# Patient Record
Sex: Female | Born: 1960 | Race: White | Hispanic: No | Marital: Single | Smoking: Smoker, current status unknown
Health system: Southern US, Community
[De-identification: ages and names within clinical notes are randomized; demographics above are authoritative.]

---

## 1997-12-10 ENCOUNTER — Encounter: Payer: Self-pay | Admitting: Urology

## 1997-12-10 ENCOUNTER — Ambulatory Visit (HOSPITAL_COMMUNITY): Admission: RE | Admit: 1997-12-10 | Discharge: 1997-12-10 | Payer: Self-pay | Admitting: Urology

## 1997-12-15 ENCOUNTER — Encounter: Payer: Self-pay | Admitting: Urology

## 1997-12-15 ENCOUNTER — Ambulatory Visit (HOSPITAL_COMMUNITY): Admission: RE | Admit: 1997-12-15 | Discharge: 1997-12-15 | Payer: Self-pay | Admitting: Urology

## 1997-12-29 ENCOUNTER — Inpatient Hospital Stay (HOSPITAL_COMMUNITY): Admission: EM | Admit: 1997-12-29 | Discharge: 1998-01-02 | Payer: Self-pay | Admitting: Urology

## 1997-12-29 ENCOUNTER — Encounter: Payer: Self-pay | Admitting: Urology

## 1997-12-30 ENCOUNTER — Encounter: Payer: Self-pay | Admitting: Urology

## 1998-04-15 ENCOUNTER — Encounter: Payer: Self-pay | Admitting: Urology

## 1998-04-16 ENCOUNTER — Inpatient Hospital Stay (HOSPITAL_COMMUNITY): Admission: RE | Admit: 1998-04-16 | Discharge: 1998-04-18 | Payer: Self-pay | Admitting: Urology

## 1998-09-17 ENCOUNTER — Emergency Department (HOSPITAL_COMMUNITY): Admission: EM | Admit: 1998-09-17 | Discharge: 1998-09-17 | Payer: Self-pay | Admitting: *Deleted

## 1998-09-18 ENCOUNTER — Encounter: Payer: Self-pay | Admitting: Emergency Medicine

## 1999-04-12 ENCOUNTER — Emergency Department (HOSPITAL_COMMUNITY): Admission: EM | Admit: 1999-04-12 | Discharge: 1999-04-12 | Payer: Self-pay | Admitting: Emergency Medicine

## 1999-04-12 ENCOUNTER — Encounter: Payer: Self-pay | Admitting: Emergency Medicine

## 1999-05-24 ENCOUNTER — Inpatient Hospital Stay (HOSPITAL_COMMUNITY): Admission: AD | Admit: 1999-05-24 | Discharge: 1999-05-25 | Payer: Self-pay | Admitting: Urology

## 1999-05-25 ENCOUNTER — Encounter: Payer: Self-pay | Admitting: Urology

## 1999-05-27 ENCOUNTER — Ambulatory Visit (HOSPITAL_COMMUNITY): Admission: RE | Admit: 1999-05-27 | Discharge: 1999-05-27 | Payer: Self-pay | Admitting: Urology

## 1999-06-03 ENCOUNTER — Ambulatory Visit (HOSPITAL_COMMUNITY): Admission: RE | Admit: 1999-06-03 | Discharge: 1999-06-03 | Payer: Self-pay | Admitting: Urology

## 1999-06-03 ENCOUNTER — Encounter: Payer: Self-pay | Admitting: Urology

## 1999-06-10 ENCOUNTER — Inpatient Hospital Stay (HOSPITAL_COMMUNITY): Admission: AD | Admit: 1999-06-10 | Discharge: 1999-06-10 | Payer: Self-pay | Admitting: Urology

## 1999-06-23 ENCOUNTER — Emergency Department (HOSPITAL_COMMUNITY): Admission: EM | Admit: 1999-06-23 | Discharge: 1999-06-23 | Payer: Self-pay | Admitting: Emergency Medicine

## 2000-08-26 ENCOUNTER — Emergency Department (HOSPITAL_COMMUNITY): Admission: EM | Admit: 2000-08-26 | Discharge: 2000-08-26 | Payer: Self-pay

## 2003-02-23 ENCOUNTER — Inpatient Hospital Stay (HOSPITAL_COMMUNITY): Admission: EM | Admit: 2003-02-23 | Discharge: 2003-02-28 | Payer: Self-pay | Admitting: Emergency Medicine

## 2004-11-29 IMAGING — CR DG ABDOMEN 1V
1 series · 1 of 1 positions shown · non-contrast
Comparison: none

CLINICAL DATA: Patient has been scheduled for an upper GI exam.  
 SCOUT RADIOGRAPH OF THE ABDOMEN 02/28/03
 Compared to 02/27/03.  
 There is persistent extensive retained colonic contrast.  This does preclude adequate upper GI at this time.  Continued cleansing enema is suggested prior to an upper GI exam.  This was discussed with Dr. Shao. 
 IMPRESSION
 Nonobstructive bowel gas.  There is persistent extensive colonic contrast.

[view not recorded]
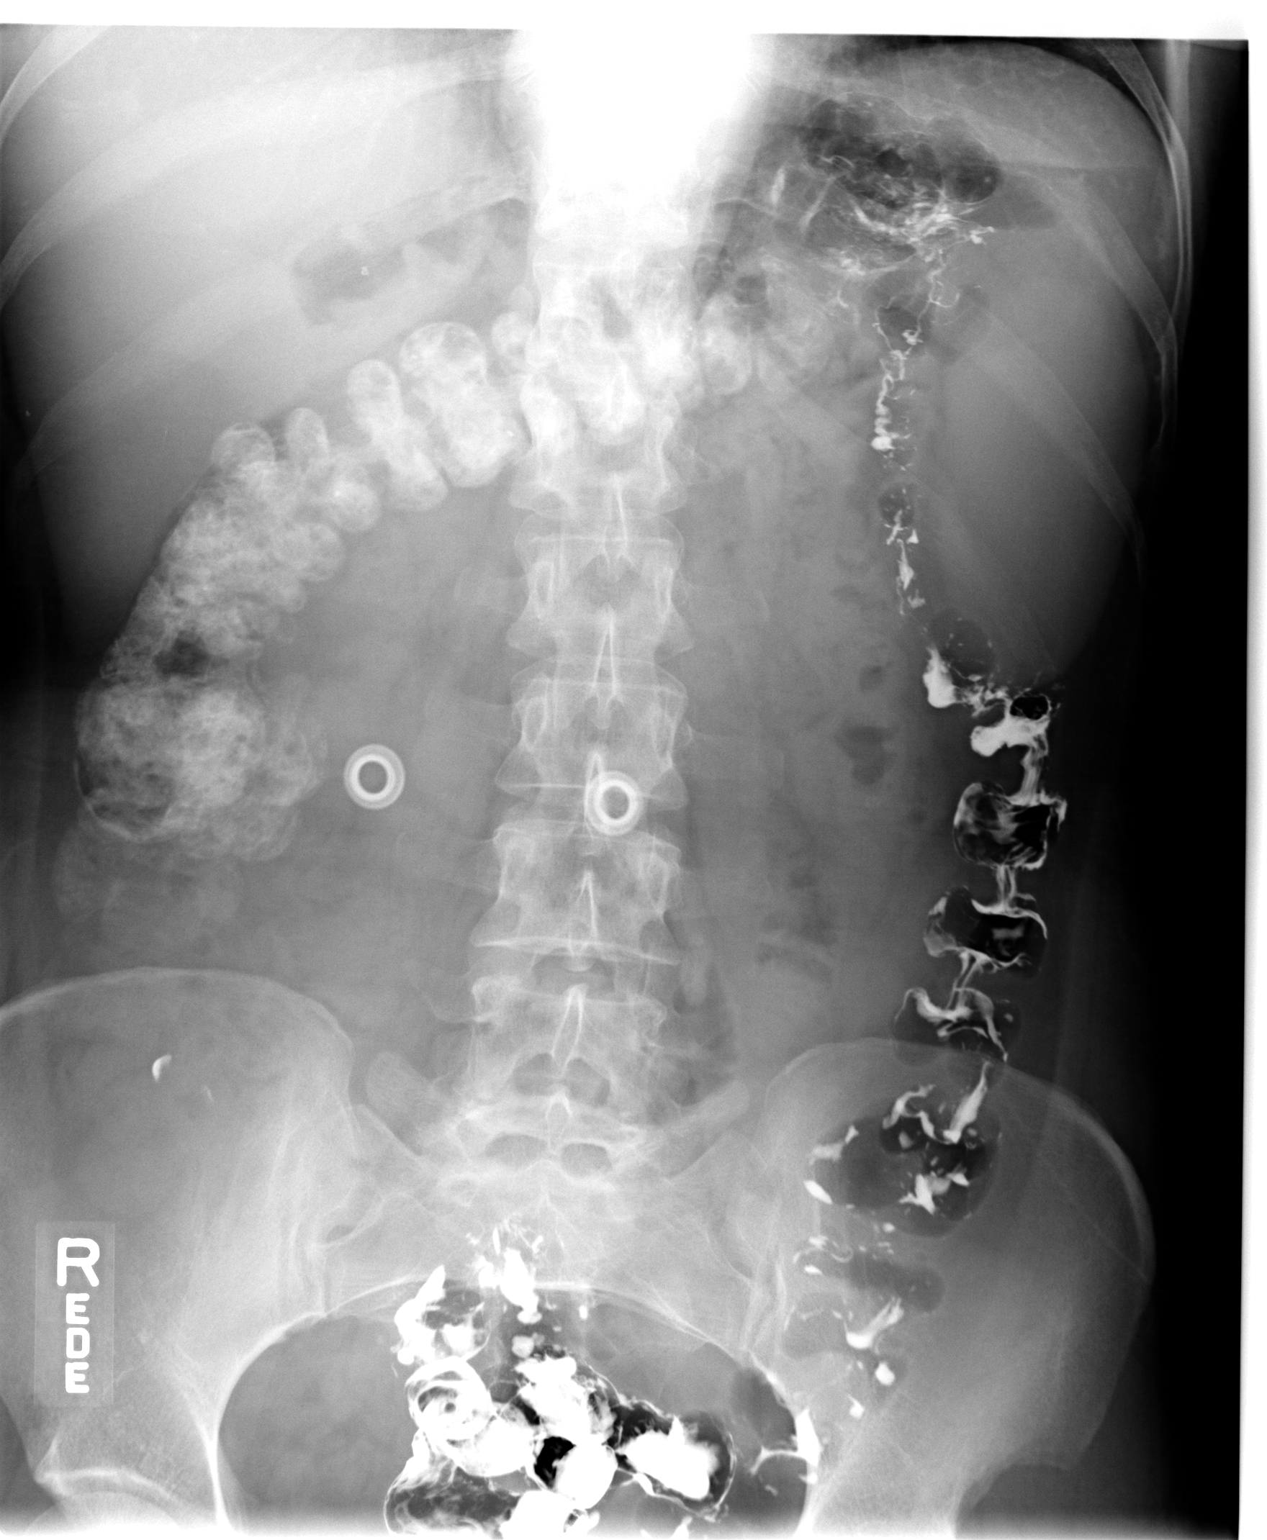

[1 of 1 positions shown; findings below may reference images not displayed]

## 2010-01-31 ENCOUNTER — Encounter: Payer: Self-pay | Admitting: Gastroenterology

## 2012-08-22 ENCOUNTER — Emergency Department (HOSPITAL_COMMUNITY): Payer: Medicaid Other

## 2012-08-22 ENCOUNTER — Inpatient Hospital Stay (HOSPITAL_COMMUNITY)
Admission: EM | Admit: 2012-08-22 | Discharge: 2012-09-10 | DRG: 296 | Disposition: E | Payer: Medicaid Other | Attending: Internal Medicine | Admitting: Internal Medicine

## 2012-08-22 DIAGNOSIS — I4510 Unspecified right bundle-branch block: Secondary | ICD-10-CM | POA: Diagnosis present

## 2012-08-22 DIAGNOSIS — E875 Hyperkalemia: Secondary | ICD-10-CM | POA: Diagnosis present

## 2012-08-22 DIAGNOSIS — G9382 Brain death: Secondary | ICD-10-CM | POA: Diagnosis not present

## 2012-08-22 DIAGNOSIS — J9600 Acute respiratory failure, unspecified whether with hypoxia or hypercapnia: Secondary | ICD-10-CM | POA: Diagnosis present

## 2012-08-22 DIAGNOSIS — G935 Compression of brain: Secondary | ICD-10-CM | POA: Diagnosis present

## 2012-08-22 DIAGNOSIS — G931 Anoxic brain damage, not elsewhere classified: Secondary | ICD-10-CM | POA: Diagnosis present

## 2012-08-22 DIAGNOSIS — Z66 Do not resuscitate: Secondary | ICD-10-CM | POA: Diagnosis not present

## 2012-08-22 DIAGNOSIS — I959 Hypotension, unspecified: Secondary | ICD-10-CM | POA: Diagnosis present

## 2012-08-22 DIAGNOSIS — E876 Hypokalemia: Secondary | ICD-10-CM | POA: Diagnosis not present

## 2012-08-22 DIAGNOSIS — N19 Unspecified kidney failure: Secondary | ICD-10-CM | POA: Diagnosis present

## 2012-08-22 DIAGNOSIS — E872 Acidosis, unspecified: Secondary | ICD-10-CM | POA: Diagnosis present

## 2012-08-22 DIAGNOSIS — R4020 Unspecified coma: Secondary | ICD-10-CM | POA: Diagnosis present

## 2012-08-22 DIAGNOSIS — G936 Cerebral edema: Secondary | ICD-10-CM | POA: Diagnosis present

## 2012-08-22 DIAGNOSIS — I469 Cardiac arrest, cause unspecified: Principal | ICD-10-CM

## 2012-08-22 LAB — COMPREHENSIVE METABOLIC PANEL
AST: 277 U/L — ABNORMAL HIGH (ref 0–37)
Albumin: 2.9 g/dL — ABNORMAL LOW (ref 3.5–5.2)
Calcium: 8.3 mg/dL — ABNORMAL LOW (ref 8.4–10.5)
Chloride: 101 mEq/L (ref 96–112)
Creatinine, Ser: 2.5 mg/dL — ABNORMAL HIGH (ref 0.50–1.10)
Total Protein: 5.4 g/dL — ABNORMAL LOW (ref 6.0–8.3)

## 2012-08-22 LAB — URINE MICROSCOPIC-ADD ON

## 2012-08-22 LAB — POCT I-STAT 3, ART BLOOD GAS (G3+)
Acid-base deficit: 10 mmol/L — ABNORMAL HIGH (ref 0.0–2.0)
O2 Saturation: 100 %
TCO2: 18 mmol/L (ref 0–100)
pCO2 arterial: 43.6 mmHg (ref 35.0–45.0)
pO2, Arterial: 503 mmHg — ABNORMAL HIGH (ref 80.0–100.0)

## 2012-08-22 LAB — BLOOD GAS, ARTERIAL
Drawn by: 31101
O2 Saturation: 98 %
PEEP: 5 cmH2O
Patient temperature: 98.6
RATE: 30 resp/min
pH, Arterial: 7.297 — ABNORMAL LOW (ref 7.350–7.450)

## 2012-08-22 LAB — CBC WITH DIFFERENTIAL/PLATELET
Basophils Absolute: 0 10*3/uL (ref 0.0–0.1)
Basophils Relative: 0 % (ref 0–1)
Eosinophils Absolute: 0.1 10*3/uL (ref 0.0–0.7)
HCT: 35.6 % — ABNORMAL LOW (ref 36.0–46.0)
MCH: 30.8 pg (ref 26.0–34.0)
MCHC: 32.6 g/dL (ref 30.0–36.0)
Monocytes Absolute: 0.3 10*3/uL (ref 0.1–1.0)
Neutro Abs: 10.2 10*3/uL — ABNORMAL HIGH (ref 1.7–7.7)
RDW: 14.4 % (ref 11.5–15.5)

## 2012-08-22 LAB — TROPONIN I: Troponin I: <0.3 ng/mL (ref <0.30)

## 2012-08-22 LAB — URINALYSIS, ROUTINE W REFLEX MICROSCOPIC
Bilirubin Urine: NEGATIVE
Ketones, ur: NEGATIVE mg/dL
Nitrite: NEGATIVE
Urobilinogen, UA: 0.2 mg/dL (ref 0.0–1.0)

## 2012-08-22 LAB — TYPE AND SCREEN: Antibody Screen: NEGATIVE

## 2012-08-22 LAB — PRO B NATRIURETIC PEPTIDE: Pro B Natriuretic peptide (BNP): 243.8 pg/mL — ABNORMAL HIGH (ref 0–125)

## 2012-08-22 LAB — RAPID URINE DRUG SCREEN, HOSP PERFORMED
Amphetamines: NOT DETECTED
Barbiturates: NOT DETECTED
Benzodiazepines: POSITIVE — AB

## 2012-08-22 LAB — PROCALCITONIN: Procalcitonin: <0.1 ng/mL

## 2012-08-22 LAB — APTT: aPTT: 24 seconds (ref 24–37)

## 2012-08-22 LAB — ETHANOL: Alcohol, Ethyl (B): <11 mg/dL (ref 0–11)

## 2012-08-22 LAB — SALICYLATE LEVEL: Salicylate Lvl: 7.7 mg/dL (ref 2.8–20.0)

## 2012-08-22 LAB — PROTIME-INR
INR: 1.01 (ref 0.00–1.49)
Prothrombin Time: 13.1 seconds (ref 11.6–15.2)

## 2012-08-22 LAB — AMYLASE: Amylase: 142 U/L — ABNORMAL HIGH (ref 0–105)

## 2012-08-22 LAB — CK: Total CK: 292 U/L — ABNORMAL HIGH (ref 7–177)

## 2012-08-22 LAB — ACETAMINOPHEN LEVEL: Acetaminophen (Tylenol), Serum: <15 ug/mL (ref 10–30)

## 2012-08-22 MED ORDER — NOREPINEPHRINE BITARTRATE 1 MG/ML IJ SOLN
0.5000 ug/min | INTRAVENOUS | Status: DC
  Administered 2012-08-23: 18 ug/min via INTRAVENOUS
  Filled 2012-08-22: qty 16

## 2012-08-22 MED ORDER — SODIUM CHLORIDE 0.9 % IV SOLN: INTRAVENOUS | Status: DC

## 2012-08-22 MED ORDER — NOREPINEPHRINE BITARTRATE 1 MG/ML IJ SOLN
0.5000 ug/min | INTRAVENOUS | Status: DC
  Administered 2012-08-22: 10 ug/min via INTRAVENOUS
  Filled 2012-08-22: qty 4

## 2012-08-22 MED ORDER — PANTOPRAZOLE SODIUM 40 MG IV SOLR
40.0000 mg | Freq: Every day | INTRAVENOUS | Status: DC
  Administered 2012-08-22: 40 mg via INTRAVENOUS
  Filled 2012-08-22 (×2): qty 40

## 2012-08-22 MED ORDER — SODIUM CHLORIDE 0.9 % IV SOLN: 250.0000 mL | INTRAVENOUS | Status: DC | PRN

## 2012-08-22 MED ORDER — SODIUM CHLORIDE 0.9 % IV BOLUS (SEPSIS)
500.0000 mL | Freq: Once | INTRAVENOUS | Status: AC
  Administered 2012-08-22: 500 mL via INTRAVENOUS

## 2012-08-22 MED ORDER — INSULIN ASPART 100 UNIT/ML ~~LOC~~ SOLN
0.0000 [IU] | SUBCUTANEOUS | Status: DC
Start: 2012-08-22 — End: 2012-08-23
  Administered 2012-08-23: 5 [IU] via SUBCUTANEOUS
  Administered 2012-08-23 (×2): 2 [IU] via SUBCUTANEOUS

## 2012-08-22 MED ORDER — SODIUM BICARBONATE 8.4 % IV SOLN
INTRAVENOUS | Status: DC
  Administered 2012-08-22: 23:00:00 via INTRAVENOUS
  Filled 2012-08-22 (×3): qty 150

## 2012-08-22 MED ORDER — MIDAZOLAM HCL 2 MG/2ML IJ SOLN: 2.0000 mg | INTRAMUSCULAR | Status: DC | PRN

## 2012-08-22 MED ORDER — LORAZEPAM 2 MG/ML IJ SOLN: 2.0000 mg | INTRAMUSCULAR | Status: DC | PRN

## 2012-08-22 MED ORDER — MIDAZOLAM HCL 2 MG/2ML IJ SOLN
2.0000 mg | Freq: Once | INTRAMUSCULAR | Status: AC
Start: 2012-08-22 — End: 2012-08-22
  Administered 2012-08-22: 2 mg via INTRAVENOUS
  Filled 2012-08-22: qty 2

## 2012-08-22 MED ORDER — FENTANYL CITRATE 0.05 MG/ML IJ SOLN: 50.0000 ug | INTRAMUSCULAR | Status: DC | PRN

## 2012-08-22 NOTE — ED Notes (Signed)
Report called to Cletis Athens, RN unit 2100.

## 2012-08-22 NOTE — H&P (Signed)
PULMONARY  / CRITICAL CARE MEDICINE  Name: Kathy Pace MRN: [id number] DOB: 1960/04/25    ADMISSION DATE:  08/28/2012 CONSULTATION DATE:  08/26/2012  REFERRING MD :  Dr Verlene Mayer PRIMARY SERVICE: PCCM  CHIEF COMPLAINT:  Arrest  BRIEF PATIENT DESCRIPTION:  89 h/o drug abuse, c/o headache prior, unknown down time, pea/ asytole  SIGNIFICANT EVENTS / STUDIES:  8/13 - Unknown downtime may be as much 1 hr 15 min, arrest pea/ asytole by ems at home 11 min 8/13- poor neuro exam  LINES / TUBES: 8/13 ETT>>> 8/13 rt ij>>>  CULTURES: BC 8/13>>>  ANTIBIOTICS:   HISTORY OF PRESENT ILLNESS:  52 yr old h/o drug abuse, had complained of headache to family. Last seen normal 3 pm, found 415 pm, no pulse, ems called. Found asytole, then pea , 11 min total to rosc. No vomit, no CP reported prior. H/o drug abuse, per family with "anything she can get her hands on". No drugs at seen. In ed, ETT placed, without no sedation. Some facial twitches.  PAST MEDICAL HISTORY :  No past medical history on file. drug use, unknown No past surgical history on file. Prior to Admission medications   Not on File   Allergies not on file  FAMILY HISTORY: unknown No family history on file. SOCIAL HISTORY:  has no tobacco, alcohol, and drug history on file.  REVIEW OF SYSTEMS:  unavailable pt coma  SUBJECTIVE:  To head CT stat, poor neuro exam  VITAL SIGNS: Pulse Rate:  [96-99] 99 (08/13 1806) Resp:  [14] 14 (08/13 1806) BP: (77-115)/(46-66) 115/66 mmHg (08/13 1806) SpO2:  [100 %] 100 % (08/13 1806) HEMODYNAMICS:   VENTILATOR SETTINGS:   INTAKE / OUTPUT: Intake/Output     08/12 0701 - 08/13 0700 08/13 0701 - 08/14 0700   I.V.  1000   Total Intake   1000   Net   +1000          PHYSICAL EXAMINATION: General:  Unresponsive to pain Neuro:  perr 3 mm , slugggish, no movement to pain stim, no cough, no gag, does have mild facial twitch chin HEENT:  Line wnl Cardiovascular:  s1 s2 rrr, no r/g Lungs:   Coarse mild Abdomen:  Soft, bs wnl , no r./g  Musculoskeletal: no rash, no sig edema  LABS:  CBC No results found for this basename: WBC, HGB, HCT, PLT,  in the last 72 hours Coag's No results found for this basename: APTT, INR,  in the last 72 hours BMET No results found for this basename: NA, K, CL, CO2, BUN, CREATININE, GLUCOSE,  in the last 72 hours Electrolytes No results found for this basename: CALCIUM, MG, PHOS,  in the last 72 hours Sepsis Markers No results found for this basename: LACTICACIDVEN, PROCALCITON, O2SATVEN,  in the last 72 hours ABG No results found for this basename: PHART, PCO2ART, PO2ART,  in the last 72 hours Liver Enzymes No results found for this basename: AST, ALT, ALKPHOS, BILITOT, ALBUMIN,  in the last 72 hours Cardiac Enzymes No results found for this basename: TROPONINI, PROBNP,  in the last 72 hours Glucose Recent Labs     08/20/2012  1752  GLUCAP  238*    Imaging Dg Chest Portable 1 View  09/03/2012   *RADIOLOGY REPORT*  Clinical Data: Shortness of breath, intubated  PORTABLE CHEST - 1 VIEW  Comparison: None.  Findings: Endotracheal tube tip is approximately 2.6 cm above carina.  Lungs are clear.  Heart size upper limits normal for  technique.  No effusion.  No pneumothorax.  Regional bones unremarkable.  IMPRESSION:  Endotracheal tube in expected location.  No acute disease.   Original Report Authenticated By: D. Hassell III, MD     CXR:  ett wnl, rt ij wnl, after rt upper lobe, wide Bronchus intermedius>?  ASSESSMENT / PLAN:  PULMONARY A:Acute resp failure, r/o narcotic OD, no overt aspiration noted P:   ETT placed To 8 cc/kg, 100%, peep 5, abg to follow May require ct chest to evaluate BI after RUL takeoff, re eval other pcxr to see if all rotation related  CARDIOVASCULAR A: s/p Arrest, Unknown Un witnessed arrest, r/o pulm suppression from OD ?, Shock r/o cardiogenic post arrest P:  ECG needed Trop assessment Levophed to MAp goal  70,: eval CT head and if pathology may escalate MAp goals CVP assessment, consider bolus  RENAL A:  Await all labs, Assume acidosis , lactic acidosis present P:   STAT chem awaited Avoid free water until CT head noted, concern edema  GASTROINTESTINAL A:  NPO, r/o shock liver P:   LFT needed ppi npo  HEMATOLOGIC A:  CVT prevention P:  STAT head CT to r/o SAH scd for now If no ICH, add sub qh ep and assess coags  INFECTIOUS A:  No evidence infection P:   BC Monitor fever curve  ENDOCRINE A:  At risk hyperglcyemia P:   cbg lft tsh  NEUROLOGIC A:  S/p Likely Prolonged downtime arrest, reported headache, r/o SAH, CVA, ich, myoclonus likely , r/o seziure focus R/o tox OD P:   STAT head CT Lactic acid, unknown downtime greater 1 hr 15 min  = No hypothermia planned Normothermia as goal Low threshold benzo for ? Myoclonus EEG Tox screen STAT  TODAY'S SUMMARY: Unknown downtime, Unwitnessed, Normothermia goal, NO hypothermia, CT head STAT, ABG  I have personally obtained a history, examined the patient, evaluated laboratory and imaging results, formulated the assessment and plan and placed orders. CRITICAL CARE: The patient is critically ill with multiple organ systems failure and requires high complexity decision making for assessment and support, frequent evaluation and titration of therapies, application of advanced monitoring technologies and extensive interpretation of multiple databases. Critical Care Time devoted to patient care services described in this note is 40 minutes.   Mcarthur Rossetti. Tyson Alias, MD, FACP Pgr: [phone] [facility]  Pulmonary and Critical Care Medicine [facility] Pager: [phone]  2012-09-13, 6:32 PM

## 2012-08-22 NOTE — ED Notes (Signed)
Physician spoke with family about patients status and having to make the decision of end of life care.

## 2012-08-22 NOTE — Significant Event (Signed)
Hyperkalemic, acidotic, oliguric IVFs changed to bicarb infusion   Billy Fischer, MD ; [facility] [phone].  After 5:30 PM or weekends, call [phone]

## 2012-08-22 NOTE — ED Notes (Signed)
Patient arrived via [facility] from home. Patients family stated the patient went to lay down with a headache around 3p today. They went to check on her at 4:15 and she was unresponsive. EMS initiated CPR and administered Epi x3 and placed a king airway and an I/O. Patient had pulses upon arrival.

## 2012-08-22 NOTE — ED Notes (Signed)
Dr. Tyson Alias MD at bedside.

## 2012-08-22 NOTE — ED Provider Notes (Signed)
CSN: [id number]     Arrival date & time 08/29/2012  1740 History     First MD Initiated Contact with Patient 08/18/2012 1743     Chief Complaint  Patient presents with  . Respiratory Distress   (Consider location/radiation/quality/duration/timing/severity/associated sxs/prior Treatment) Patient is a 52 y.o. female presenting with altered mental status. The history is provided by the EMS personnel and a relative. The history is limited by the condition of the patient.  Altered Mental Status Presenting symptoms: unresponsiveness   Severity:  Severe Most recent episode:  Today Episode history:  Single Timing:  Constant Progression:  Unchanged Chronicity:  New Context: drug use (possible)   Associated symptoms comment:  Unable to communicate    No past medical history on file. No past surgical history on file. No family history on file. History  Substance Use Topics  . Smoking status: Not on file  . Smokeless tobacco: Not on file  . Alcohol Use: Not on file   OB History   No data available     Review of Systems  Unable to perform ROS: Patient unresponsive    Allergies  Review of patient's allergies indicates no known allergies.  Home Medications  No current outpatient prescriptions on file. BP 106/70  Pulse 83  Resp 30  Ht 5\' 2"  (1.575 m)  Wt 170 lb (77.111 kg)  BMI 31.09 kg/m2  SpO2 99% Physical Exam  Nursing note and vitals reviewed. Constitutional: She appears well-developed and well-nourished. She appears distressed.  HENT:  Head: Normocephalic and atraumatic.  Right Ear: External ear normal.  Left Ear: External ear normal.  Nose: Nose normal.  Mouth/Throat: Oropharynx is clear and moist. No oropharyngeal exudate.  Eyes: Conjunctivae are normal. Right eye exhibits no discharge. Left eye exhibits no discharge.  Neck: No JVD present. No tracheal deviation present. No thyromegaly present.  Cardiovascular: Normal rate, regular rhythm, normal heart sounds and  intact distal pulses.  Exam reveals no gallop and no friction rub.   No murmur heard. Pulmonary/Chest: Effort normal. No respiratory distress. She has no wheezes. She has no rales.  Abdominal: Soft. She exhibits no distension.  Musculoskeletal: Normal range of motion.  Lymphadenopathy:    She has no cervical adenopathy.  Neurological: She is unresponsive. A cranial nerve deficit is present. GCS eye subscore is 1. GCS verbal subscore is 1. GCS motor subscore is 1.  Patient with non-reactive pupils. Patient did not respond to intubation, did not require sedation. Not making any movements, no corneal reflex or gag reflex  Skin: Skin is warm. No rash noted. She is not diaphoretic.    ED Course   INTUBATION Date/Time: 08/27/2012 6:00 PM Performed by: Sherryl Manges Authorized by: Joya Gaskins Consent: The procedure was performed in an emergent situation. Patient identity confirmed: arm band and hospital-assigned identification number Time out: Immediately prior to procedure a "time out" was called to verify the correct patient, procedure, equipment, support staff and site/side marked as required. Indications: respiratory failure Intubation method: fiberoptic oral Patient status: unconscious Preoxygenation: king airway. Pretreatment medications: none Paralytic: none Laryngoscope size: Mac 4 Tube size: 7.5 mm Tube type: cuffed Number of attempts: 1 Cords visualized: yes Post-procedure assessment: chest rise and CO2 detector Breath sounds: equal and absent over the epigastrium Cuff inflated: yes ETT to lip: 23 cm ETT to teeth: 22 cm Tube secured with: ETT holder Chest x-ray interpreted by me and radiologist. Chest x-ray findings: endotracheal tube in appropriate position Patient tolerance: Patient tolerated the procedure well  with no immediate complications.  CENTRAL LINE Date/Time: 09/03/2012 6:15 PM Performed by: Sherryl Manges Authorized by: Joya Gaskins Consent:  The procedure was performed in an emergent situation. Patient identity confirmed: arm band and hospital-assigned identification number Time out: Immediately prior to procedure a "time out" was called to verify the correct patient, procedure, equipment, support staff and site/side marked as required. Indications: vascular access and central pressure monitoring Patient sedated: no Preparation: skin prepped with ChloraPrep Skin prep agent dried: skin prep agent completely dried prior to procedure Sterile barriers: all five maximum sterile barriers used - cap, mask, sterile gown, sterile gloves, and large sterile sheet Hand hygiene: hand hygiene performed prior to central venous catheter insertion Location details: right internal jugular Site selection rationale: good [place] view Patient position: flat Catheter type: triple lumen Ultrasound guidance: yes Number of attempts: 1 Successful placement: yes Post-procedure: line sutured and dressing applied Assessment: blood return through all ports, free fluid flow, placement verified by x-ray and no pneumothorax on x-ray Patient tolerance: Patient tolerated the procedure well with no immediate complications.   (including critical care time)  Labs Reviewed  CBC WITH DIFFERENTIAL - Abnormal; Notable for the following:    WBC 13.0 (*)    RBC 3.77 (*)    Hemoglobin 11.6 (*)    HCT 35.6 (*)    Neutrophils Relative % 79 (*)    Neutro Abs 10.2 (*)    All other components within normal limits  COMPREHENSIVE METABOLIC PANEL - Abnormal; Notable for the following:    Potassium 6.1 (*)    CO2 15 (*)    Glucose, Bld 275 (*)    BUN 29 (*)    Creatinine, Ser 2.50 (*)    Calcium 8.3 (*)    Total Protein 5.4 (*)    Albumin 2.9 (*)    AST 277 (*)    ALT 149 (*)    Total Bilirubin 0.1 (*)    GFR calc non Af Amer 21 (*)    GFR calc Af Amer 24 (*)    All other components within normal limits  URINALYSIS, ROUTINE W REFLEX MICROSCOPIC - Abnormal; Notable  for the following:    APPearance TURBID (*)    Hgb urine dipstick LARGE (*)    Protein, ur 100 (*)    Leukocytes, UA TRACE (*)    All other components within normal limits  CK - Abnormal; Notable for the following:    Total CK 292 (*)    All other components within normal limits  URINE RAPID DRUG SCREEN (HOSP PERFORMED) - Abnormal; Notable for the following:    Opiates POSITIVE (*)    Benzodiazepines POSITIVE (*)    All other components within normal limits  GLUCOSE, CAPILLARY - Abnormal; Notable for the following:    Glucose-Capillary 238 (*)    All other components within normal limits  AMYLASE - Abnormal; Notable for the following:    Amylase 142 (*)    All other components within normal limits  LACTIC ACID, PLASMA - Abnormal; Notable for the following:    Lactic Acid, Venous 7.2 (*)    All other components within normal limits  PRO B NATRIURETIC PEPTIDE - Abnormal; Notable for the following:    Pro B Natriuretic peptide (BNP) 243.8 (*)    All other components within normal limits  BLOOD GAS, ARTERIAL - Abnormal; Notable for the following:    pH, Arterial 7.297 (*)    pCO2 arterial 28.0 (*)    pO2, Arterial  124.0 (*)    Bicarbonate 13.2 (*)    Acid-base deficit 12.0 (*)    All other components within normal limits  URINE MICROSCOPIC-ADD ON - Abnormal; Notable for the following:    Bacteria, UA FEW (*)    All other components within normal limits  CG4 I-STAT (LACTIC ACID) - Abnormal; Notable for the following:    Lactic Acid, Venous 7.04 (*)    All other components within normal limits  POCT I-STAT 3, BLOOD GAS (G3+) - Abnormal; Notable for the following:    pH, Arterial 7.203 (*)    pO2, Arterial 503.0 (*)    Bicarbonate 17.1 (*)    Acid-base deficit 10.0 (*)    All other components within normal limits  CULTURE, BLOOD (ROUTINE X 2)  CULTURE, BLOOD (ROUTINE X 2)  URINE CULTURE  MRSA PCR SCREENING  LIPASE, BLOOD  PROTIME-INR  TROPONIN I  ACETAMINOPHEN LEVEL   AMMONIA  ETHANOL  SALICYLATE LEVEL  PROCALCITONIN  APTT  TROPONIN I  TROPONIN I  URINALYSIS, ROUTINE W REFLEX MICROSCOPIC  CBC  BASIC METABOLIC PANEL  BLOOD GAS, ARTERIAL  TSH  TYPE AND SCREEN  ABO/RH   Ct Head Wo Contrast  09/05/2012   *RADIOLOGY REPORT*  Clinical Data: Headache, unresponsive, pulseless.  CT HEAD WITHOUT CONTRAST  Technique:  Contiguous axial images were obtained from the base of the skull through the vertex without contrast.  Comparison: None.  Findings: There is diffuse cerebral edema   with loss of visualization of the gray-white junction in bilateral temporal and frontal lobes, junction somewhat better   preserved towards the vertex.  There is effacement of sulci, narrowing of the lateral ventricles, effacement of basilar cisterns, and effacement of the CSF at the level of the foramen magnum suggesting early transtentorial herniation.  Negative for acute intracranial hemorrhage, midline shift, or mass.  Regional bones unremarkable.  IMPRESSION:  Diffuse cerebral edema with resultant diffuse mass effect, transtentorial early herniation, effacement of sulci and basilar cisterns.  No evidence of acute hemorrhage. I discussed the critical test results over the telephone with Dr. Bebe Shaggy at the time of interpretation.   Original Report Authenticated By: D. Andria Rhein, MD   Dg Chest Portable 1 View  09/03/2012   *RADIOLOGY REPORT*  Clinical Data: Evaluate central line placement.  Respiratory distress.  PORTABLE CHEST - 1 VIEW  Comparison: 08/14/2012 at 1752.  Findings: Endotracheal tube unchanged.  There has been interval placement of a right IJ central venous catheter with tip over the origin of the cavoatrial junction.  Lungs are adequately inflated without consolidation or effusion.  No definite pneumothorax. Cardiomediastinal silhouette and remainder of the exam is unchanged.  IMPRESSION: No acute cardiopulmonary disease.  Tubes and lines as described.   Original Report  Authenticated By: Elberta Fortis, M.D.   Dg Chest Portable 1 View  08/11/2012   *RADIOLOGY REPORT*  Clinical Data: Shortness of breath, intubated  PORTABLE CHEST - 1 VIEW  Comparison: None.  Findings: Endotracheal tube tip is approximately 2.6 cm above carina.  Lungs are clear.  Heart size upper limits normal for technique.  No effusion.  No pneumothorax.  Regional bones unremarkable.  IMPRESSION:  Endotracheal tube in expected location.  No acute disease.   Original Report Authenticated By: D. Andria Rhein, MD   1. Cardiac arrest   2. Acute respiratory failure   3. Anoxic brain damage     MDM  52 yr old F patient here after arrest at home. Patient was complaining of  HA according to family and then went down for a nap. Patient was then discovered to be unresponsive in her bed. There was a gap of about 75 minutes between when she went to lie down and when she was found unresponsive. The patient here with GCS of 3 and no reflexes elicited on my exam. Patient had ROSC after three round of epi and chest compressions in the field by EMS. Patient here with sinus rhythm and initially normal BP's. King airway traded out for [employer] without sedation or paralysis or complication. Patient then became more hypotensive. Levophed started through peripheral IV with good effect while I place central line. Central line placed without difficulty. Patient with lactic acidosis and renal failure. Patient also with signs of edema and impending herniation on CT. Patient admitted to ICU. Prognosis is poor and patient family informed of the situation.   Date: 08/15/2012  Rate: 88  Rhythm: normal sinus rhythm  QRS Axis: normal  Intervals: normal  ST/T Wave abnormalities: normal  Conduction Disutrbances:right bundle branch block  Narrative Interpretation:   Old EKG Reviewed: none available  Case discussed with Dr. Macario Carls, MD 09/03/2012 [phone]

## 2012-08-22 NOTE — Progress Notes (Signed)
Pt transported from ED to 2101. No complications.

## 2012-08-23 ENCOUNTER — Inpatient Hospital Stay (HOSPITAL_COMMUNITY): Payer: Medicaid Other

## 2012-08-23 ENCOUNTER — Other Ambulatory Visit (HOSPITAL_COMMUNITY): Payer: Self-pay

## 2012-08-23 ENCOUNTER — Encounter (HOSPITAL_COMMUNITY): Payer: Self-pay | Admitting: *Deleted

## 2012-08-23 DIAGNOSIS — I517 Cardiomegaly: Secondary | ICD-10-CM

## 2012-08-23 LAB — GLUCOSE, CAPILLARY: Glucose-Capillary: 75 mg/dL (ref 70–99)

## 2012-08-23 LAB — POCT I-STAT 3, ART BLOOD GAS (G3+)
Acid-base deficit: 3 mmol/L — ABNORMAL HIGH (ref 0.0–2.0)
Acid-base deficit: 3 mmol/L — ABNORMAL HIGH (ref 0.0–2.0)
Acid-base deficit: 5 mmol/L — ABNORMAL HIGH (ref 0.0–2.0)
Bicarbonate: 21.5 mEq/L (ref 20.0–24.0)
Bicarbonate: 24.1 mEq/L — ABNORMAL HIGH (ref 20.0–24.0)
Bicarbonate: 25.5 mEq/L — ABNORMAL HIGH (ref 20.0–24.0)
O2 Saturation: 92 %
O2 Saturation: 98 %
O2 Saturation: 100 %
O2 Saturation: 100 %
O2 Saturation: 100 %
Patient temperature: 96.4
Patient temperature: 96.1
Patient temperature: 96.1
TCO2: 18 mmol/L (ref 0–100)
TCO2: 21 mmol/L (ref 0–100)
TCO2: 23 mmol/L (ref 0–100)
TCO2: 26 mmol/L (ref 0–100)
TCO2: 27 mmol/L (ref 0–100)
pCO2 arterial: 25.5 mmHg — ABNORMAL LOW (ref 35.0–45.0)
pCO2 arterial: 25.6 mmHg — ABNORMAL LOW (ref 35.0–45.0)
pH, Arterial: 7.424 (ref 7.350–7.450)
pH, Arterial: 7.49 — ABNORMAL HIGH (ref 7.350–7.450)
pH, Arterial: 7.406 (ref 7.350–7.450)
pO2, Arterial: 53 mmHg — ABNORMAL LOW (ref 80.0–100.0)
pO2, Arterial: 84 mmHg (ref 80.0–100.0)
pO2, Arterial: 325 mmHg — ABNORMAL HIGH (ref 80.0–100.0)

## 2012-08-23 LAB — CBC
HCT: 37.1 % (ref 36.0–46.0)
Platelets: 236 10*3/uL (ref 150–400)
RDW: 13.6 % (ref 11.5–15.5)
WBC: 17.6 10*3/uL — ABNORMAL HIGH (ref 4.0–10.5)

## 2012-08-23 LAB — MRSA PCR SCREENING: MRSA by PCR: NEGATIVE

## 2012-08-23 LAB — COMPREHENSIVE METABOLIC PANEL
ALT: 154 U/L — ABNORMAL HIGH (ref 0–35)
AST: 239 U/L — ABNORMAL HIGH (ref 0–37)
Albumin: 3 g/dL — ABNORMAL LOW (ref 3.5–5.2)
Alkaline Phosphatase: 90 U/L (ref 39–117)
Calcium: 8.4 mg/dL (ref 8.4–10.5)
GFR calc Af Amer: 29 mL/min — ABNORMAL LOW (ref >90)
Glucose, Bld: 80 mg/dL (ref 70–99)
Potassium: 3.2 mEq/L — ABNORMAL LOW (ref 3.5–5.1)
Sodium: 145 mEq/L (ref 135–145)
Total Protein: 5.5 g/dL — ABNORMAL LOW (ref 6.0–8.3)

## 2012-08-23 LAB — BASIC METABOLIC PANEL
Chloride: 106 mEq/L (ref 96–112)
Creatinine, Ser: 2.08 mg/dL — ABNORMAL HIGH (ref 0.50–1.10)
GFR calc Af Amer: 30 mL/min — ABNORMAL LOW (ref >90)
GFR calc non Af Amer: 26 mL/min — ABNORMAL LOW (ref >90)

## 2012-08-23 LAB — TROPONIN I: Troponin I: 0.39 ng/mL (ref <0.30)

## 2012-08-23 MED ORDER — POTASSIUM CHLORIDE 10 MEQ/50ML IV SOLN
10.0000 meq | INTRAVENOUS | Status: AC
  Administered 2012-08-23: 10 meq via INTRAVENOUS
  Filled 2012-08-23: qty 150

## 2012-08-23 MED ORDER — PRO-STAT SUGAR FREE PO LIQD
30.0000 mL | Freq: Four times a day (QID) | ORAL | Status: DC
  Filled 2012-08-23 (×3): qty 30

## 2012-08-23 MED ORDER — CHLORHEXIDINE GLUCONATE 0.12 % MT SOLN
15.0000 mL | Freq: Two times a day (BID) | OROMUCOSAL | Status: DC
  Administered 2012-08-23: 15 mL via OROMUCOSAL
  Filled 2012-08-23: qty 15

## 2012-08-23 MED ORDER — ADULT MULTIVITAMIN W/MINERALS CH: 1.0000 | ORAL_TABLET | Freq: Every day | ORAL | Status: DC

## 2012-08-23 MED ORDER — BIOTENE DRY MOUTH MT LIQD
15.0000 mL | Freq: Four times a day (QID) | OROMUCOSAL | Status: DC
  Administered 2012-08-23: 15 mL via OROMUCOSAL

## 2012-08-23 MED ORDER — VITAL AF 1.2 CAL PO LIQD
1000.0000 mL | ORAL | Status: DC
  Filled 2012-08-23 (×2): qty 1000

## 2012-08-23 MED ORDER — DEXTROSE 5 % IV SOLN: INTRAVENOUS | Status: DC

## 2012-08-23 MED ORDER — VITAL AF 1.2 CAL PO LIQD
1000.0000 mL | ORAL | Status: DC
  Filled 2012-08-23: qty 1000

## 2012-08-23 MED ORDER — SODIUM CHLORIDE 0.9 % IV BOLUS (SEPSIS)
750.0000 mL | Freq: Once | INTRAVENOUS | Status: AC
  Administered 2012-08-23: 750 mL via INTRAVENOUS

## 2012-08-23 MED ORDER — POTASSIUM CHLORIDE 20 MEQ/15ML (10%) PO LIQD
40.0000 meq | ORAL | Status: DC
  Filled 2012-08-23 (×2): qty 30

## 2012-08-23 MED ORDER — HEPARIN SODIUM (PORCINE) 5000 UNIT/ML IJ SOLN
5000.0000 [IU] | Freq: Three times a day (TID) | INTRAMUSCULAR | Status: DC
  Filled 2012-08-23 (×4): qty 1

## 2012-08-23 MED ORDER — SODIUM CHLORIDE 0.9 % IV SOLN: 250.0000 mL | INTRAVENOUS | Status: DC | PRN

## 2012-08-23 MED ORDER — ADULT MULTIVITAMIN LIQUID CH
5.0000 mL | Freq: Every day | ORAL | Status: DC
  Filled 2012-08-23: qty 5

## 2012-08-23 MED ORDER — ASPIRIN 81 MG PO CHEW: 81.0000 mg | CHEWABLE_TABLET | Freq: Every day | ORAL | Status: DC

## 2012-08-24 LAB — URINE CULTURE
Colony Count: NO GROWTH
Culture: NO GROWTH

## 2012-08-24 NOTE — ED Provider Notes (Signed)
I have personally seen and examined the patient and discussed plan of care with the resident.  I was present for entire procedures.  I have reviewed the appropriate documentation on PMH/FH/Soc. History.  I have reviewed the documentation of the resident and agree.  I was present for both intubation and central line placement - entire procedure  CRITICAL CARE Performed by: Joya Gaskins Total critical care time: 36 Critical care time was exclusive of separately billable procedures and treating other patients. Critical care was necessary to treat or prevent imminent or life-threatening deterioration. Critical care was time spent personally by me on the following activities: development of treatment plan with patient and/or surrogate as well as nursing, discussions with consultants, evaluation of patient's response to treatment, examination of patient, obtaining history from patient or surrogate, ordering and performing treatments and interventions, ordering and review of laboratory studies, ordering and review of radiographic studies, pulse oximetry and re-evaluation of patient's condition.  I have reviewed and agree with the ECG interpretation(s) documented by the resident.      Pt ill appearing on arrival, intubated and central line placed by resident Pt found to have anoxic brain injury I spoke to dr Tyson Alias and initial plan was not to initiate hypothermia Pt admitted to ICU    Joya Gaskins, MD 08/24/12 [phone]

## 2012-08-28 LAB — CULTURE, BLOOD (ROUTINE X 2): Culture: NO GROWTH

## 2012-09-07 NOTE — Discharge Summary (Signed)
NAMEMarland Kitchen  Kathy Pace, Kathy Pace                ACCOUNT NO.:  [id number]  MEDICAL RECORD NO.:  [id number]  LOCATION:                               FACILITY:  [facility]  PHYSICIAN:  Nelda Bucks, MD DATE OF BIRTH:  11-21-1960  DATE OF ADMISSION:  08/31/2012 DATE OF DISCHARGE:  08/21/2012                              DISCHARGE SUMMARY   DEATH SUMMARY  This was a 52 year old drug abuser with history of narcotic abuse __________ and chronic pain, who has had problems in the past with narcotic use causing respiratory distress, who presented to the [facility] Emergency Room, status post  PEA asystolic arrest with unknown downtime, last being seen approximately 1 hour and 15 minutes prior to being found in this arrhythmia.  The patient returned __________ after approximately 11 minutes by EMS and presented to the [facility] Emergency Room, where she was found with very poor neurologic examination.  It was readily apparent that the patient's downtime was extensive.  An IJ line was placed secondary to shock and pressor needs.  Endotracheal tube was placed of course. Physical examination by the next morning revealed consistent with brain death/herniation.  The patient was preoxygenated.  Family discussions__________.  The patient's tox screen was negative essentially except for opioids.  Apnea test revealed the patient was brain dead and the patient was pronounced dead.  The family had spent some time after that with the body and then the body was discontinued from life support.  FINAL DIAGNOSES UPON DEATH: 1. 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 2. Likely narcotic suppression of respiratory drive leading to cardiac     arrest. 3. Status post cardiac arrest, pulseless electrical activity asystole.     Nelda Bucks, MD   ______________________________ Nelda Bucks, MD    DJF/MEDQ  D:  09/05/2012  T:  09/06/2012  Job:  [id number]

## 2012-09-10 NOTE — Progress Notes (Signed)
[place]ton [place] and Amalia Hailey Corns Patient sons and NOK are present at bedside with other family for planned extubation. 2 Gold colored rings, 2 pink pearl like ear rings, 1 gold ring with clear stone, Gold ring with large square red stone, 1 gold ring heart shaped red stone with 2 small clear stones, 1 gold ring with oval red stone and 6 small clear stones are given to them as not to be sent to funeral home. No family present at this time have objection.  Jacqulyn Cane RN, BSN, CCRN

## 2012-09-10 NOTE — Progress Notes (Signed)
Chaplain visited pt and family members at the request of the nurse. Family were gathered in conference room after receiving updates from doctor. Pt was unresponsive and intubated. Two children of pt were not in agreement of the  disposition plans of their mother's body. Chaplain provided ministry of presence, empathic listening, grief support, liaison between nurse and family members. Family left the hospital and will come back in at 7pm to decide on extubating pt. Chaplain will brief on call chaplain. Kelle Darting [phone]   09/09/2012 1633  Clinical Encounter Type  Visited With Patient and family together  Visit Type Initial;Spiritual support;Critical Care;Death;Patient actively dying  Referral From Nurse  Spiritual Encounters  Spiritual Needs Prayer;Emotional;Grief support  Stress Factors  Patient Stress Factors Lack of knowledge;Not reviewed  Family Stress Factors Family relationships;Loss

## 2012-09-10 NOTE — Progress Notes (Addendum)
I have had extensive discussions with family. We discussed patients current circumstances and organ failures.  Family has decided to NOT perform resuscitation if arrest but to continue current medical support for now. They understand likely apnea. Total ccm time 85 min   Mcarthur Rossetti. Tyson Alias, MD, FACP Pgr: [phone] [facility]

## 2012-09-10 NOTE — Progress Notes (Signed)
Chaplain Note: Unit secretary paged stating pt was being extubated and they wanted chaplain on standby...would page when chaplain was needed. Later checked with nurse and was informed pt had died and family left soon after.  Rutherford Nail Chaplain

## 2012-09-10 NOTE — Progress Notes (Signed)
CRITICAL VALUE ALERT  Critical value received:  Troponin 0.39  Date of notification:  08/26/2012   Time of notification:  7:49 AM   Critical value read back:yes  Nurse who received alert:  Diona Fanti RN  MD notified (1st page):  Dr. Tyson Alias  Time of first page:  7:50 AM   MD notified (2nd page):  Time of second page:  Responding MD:  Dr. Tyson Alias  Time MD responded:  Dr. Tyson Alias

## 2012-09-10 NOTE — Progress Notes (Signed)
Utilization review completed. Mark Hassey, RN, BSN. 

## 2012-09-10 NOTE — Progress Notes (Signed)
INITIAL NUTRITION ASSESSMENT  DOCUMENTATION CODES Per approved criteria  -Obesity Unspecified   INTERVENTION:   1. Initiate Vital AF 1.2 @ 25 ml/hr via OG tube.  2. 30 ml Prostat QID.  3. MVI daily.  At goal rate, tube feeding regimen will provide 1120 kcal (22 kcal/kg IBW), 105 grams of protein, and 486 ml of H2O.    NUTRITION DIAGNOSIS: Inadequate oral intake related to inability to eat as evidenced by NPO status.  Goal: Enteral nutrition to provide 60-70% of estimated calorie needs (22-25 kcals/kg ideal body weight) and 100% of estimated protein needs, based on ASPEN guidelines for permissive underfeeding in critically ill obese individuals.  Monitor:  TF tolerance, plan of care, vent status  Reason for Assessment: Consult received to initiate and manage enteral nutrition support.  52 y.o. female  Admitting Dx: Arrest  ASSESSMENT: Patient is currently intubated on ventilator support s/p possible overdose. Per MD pt with poor prognosis but will continue current level of care.  MV: 12 L/min Temp:Temp (24hrs), Avg:94.6 F (34.8 C), Min:93.6 F (34.2 C), Max:95.5 F (35.3 C)  Height: Ht Readings from Last 1 Encounters:  08/26/2012 5\' 2"  (1.575 m)    Weight: Wt Readings from Last 1 Encounters:  08/19/2012 169 lb 1.6 oz (76.703 kg)    Ideal Body Weight: 50 kg   % Ideal Body Weight: 153%  Wt Readings from Last 10 Encounters:  08/31/2012 169 lb 1.6 oz (76.703 kg)    Usual Body Weight: unknown  % Usual Body Weight: -  BMI:  Body mass index is 30.92 kg/(m^2).  Estimated Nutritional Needs: Kcal: 1447 Protein: >/= 100 grams Fluid: > 1.5 L/day  Skin: no issues noted  Diet Order: NPO  EDUCATION NEEDS: -No education needs identified at this time   Intake/Output Summary (Last 24 hours) at 08/13/2012 0913 Last data filed at 08/31/2012 0700  Gross per 24 hour  Intake 2343.76 ml  Output    375 ml  Net 1968.76 ml    Last BM: 8/14   Labs:   Recent  Labs Lab 09/03/2012 1916 08/18/2012 0704  NA 138 142  K 6.1* 2.9*  CL 101 106  CO2 15* 20  BUN 29* 42*  CREATININE 2.50* 2.08*  CALCIUM 8.3* 8.5  GLUCOSE 275* 159*    CBG (last 3)   Recent Labs  08/22/2012 2341 08/17/2012 0420 08/14/2012 0746  GLUCAP 263* 156* 160*    Scheduled Meds: . antiseptic oral rinse  15 mL Mouth Rinse QID  . aspirin  81 mg Oral Daily  . chlorhexidine  15 mL Mouth Rinse BID  . heparin subcutaneous  5,000 Units Subcutaneous Q8H  . insulin aspart  0-9 Units Subcutaneous Q4H  . pantoprazole (PROTONIX) IV  40 mg Intravenous QHS  . potassium chloride  40 mEq Per Tube Q4H  . sodium chloride  750 mL Intravenous Once    Continuous Infusions: . feeding supplement (VITAL AF 1.2 CAL)    . norepinephrine (LEVOPHED) Adult infusion 10 mcg/min (08/10/2012 0845)    History reviewed. No pertinent past medical history.  History reviewed. No pertinent past surgical history.  Kendell Bane RD, LDN, CNSC [phone] Pager [phone] After Hours Pager

## 2012-09-10 NOTE — Progress Notes (Signed)
Unable to monitor CVP, writer questions equipment after time spent trouble shooting CVC. Dr. Tyson Alias made aware.   Jacqulyn Cane RN, BSN, CCRN

## 2012-09-10 NOTE — H&P (Signed)
PULMONARY  / CRITICAL CARE MEDICINE  Name: Delorse Shane MRN: [id number] DOB: 1960-08-28    ADMISSION DATE:  08/13/2012 CONSULTATION DATE:  09/02/2012  REFERRING MD :  Dr Verlene Mayer PRIMARY SERVICE: PCCM  CHIEF COMPLAINT:  Arrest  BRIEF PATIENT DESCRIPTION:  32 h/o drug abuse, c/o headache prior, unknown down time, pea/ asytole  SIGNIFICANT EVENTS / STUDIES:  8/13 - Unknown downtime may be as much 1 hr 15 min, arrest pea/ asytole by ems at home 11 min 8/13- poor neuro exam 8/14- high rate on vent  LINES / TUBES: 8/13 ETT>>> 8/13 rt ij>>>  CULTURES: BC 8/13>>>  ANTIBIOTICS:  SUBJECTIVE:  Poor neurostatus  VITAL SIGNS: Temp:  [93.6 F (34.2 C)-95.5 F (35.3 C)] 95.5 F (35.3 C) (08/14 0723) Pulse Rate:  [70-101] 82 (08/14 0723) Resp:  [14-30] 30 (08/14 0723) BP: (77-142)/(46-94) 133/89 mmHg (08/14 0723) SpO2:  [95 %-100 %] 100 % (08/14 0723) FiO2 (%):  [35 %-100 %] 40 % (08/14 0724) Weight:  [76.703 kg (169 lb 1.6 oz)-77.111 kg (170 lb)] 76.703 kg (169 lb 1.6 oz) (08/14 0500) HEMODYNAMICS: CVP:  [9 mmHg-39 mmHg] 39 mmHg VENTILATOR SETTINGS: Vent Mode:  [-] PRVC FiO2 (%):  [35 %-100 %] 40 % Set Rate:  [18 bmp-30 bmp] 30 bmp Vt Set:  [400 mL-500 mL] 400 mL PEEP:  [5 cmH20] 5 cmH20 Plateau Pressure:  [16 cmH20-22 cmH20] 19 cmH20 INTAKE / OUTPUT: Intake/Output     08/13 0701 - 08/14 0700 08/14 0701 - 08/15 0700   I.V. (mL/kg) 2343.8 (30.6)    Total Intake(mL/kg) 2343.8 (30.6)    Urine (mL/kg/hr) 375    Total Output 375     Net +1968.8          Stool Occurrence 1 x      PHYSICAL EXAMINATION: General:  Unresponsive to pain Neuro:  Fixed and dilated 3 mm, no movement to any pain any ext, no cough, no gag, no corneal's - c/w brain death, does not breath over vent rate HEENT:  Line clean Cardiovascular:  s1 s2 rrr, no r/g Lungs:  Coarse resolved Abdomen:  Soft, bs wnl , no r./g  Musculoskeletal: no rash, no sig edema  LABS:  CBC Recent Labs     08/19/2012  1745   2012/09/03  0704  WBC  13.0*  17.6*  HGB  11.6*  13.1  HCT  35.6*  37.1  PLT  239  236   Coag's Recent Labs     08/31/2012  1745  08/20/2012  1853  APTT   --   24  INR  1.01   --    BMET Recent Labs     08/30/2012  1916  2012/09/03  0704  NA  138  142  K  6.1*  2.9*  CL  101  106  CO2  15*  20  BUN  29*  42*  CREATININE  2.50*  2.08*  GLUCOSE  275*  159*   Electrolytes Recent Labs     08/15/2012  1916  Sep 03, 2012  0704  CALCIUM  8.3*  8.5   Sepsis Markers Recent Labs     09/04/2012  1916  PROCALCITON  <0.10   ABG Recent Labs     08/28/2012  1900  08/22/12  2121  PHART  7.203*  7.297*  PCO2ART  43.6  28.0*  PO2ART  503.0*  124.0*   Liver Enzymes Recent Labs     08/22/12  1916  AST  277*  ALT  149*  ALKPHOS  87  BILITOT  0.1*  ALBUMIN  2.9*   Cardiac Enzymes Recent Labs     08/27/2012  1853  08/10/2012  1916  08/13/2012  0704  TROPONINI   --   <0.30  0.39*  PROBNP  243.8*   --    --    Glucose Recent Labs     08/20/2012  1752  08/11/2012  2341  09/01/2012  0420  GLUCAP  238*  263*  156*    Imaging Ct Head Wo Contrast  09/07/2012   *RADIOLOGY REPORT*  Clinical Data: Headache, unresponsive, pulseless.  CT HEAD WITHOUT CONTRAST  Technique:  Contiguous axial images were obtained from the base of the skull through the vertex without contrast.  Comparison: None.  Findings: There is diffuse cerebral edema   with loss of visualization of the gray-white junction in bilateral temporal and frontal lobes, junction somewhat better   preserved towards the vertex.  There is effacement of sulci, narrowing of the lateral ventricles, effacement of basilar cisterns, and effacement of the CSF at the level of the foramen magnum suggesting early transtentorial herniation.  Negative for acute intracranial hemorrhage, midline shift, or mass.  Regional bones unremarkable.  IMPRESSION:  Diffuse cerebral edema with resultant diffuse mass effect, transtentorial early herniation, effacement of  sulci and basilar cisterns.  No evidence of acute hemorrhage. I discussed the critical test results over the telephone with Dr. Bebe Shaggy at the time of interpretation.   Original Report Authenticated By: D. Andria Rhein, MD   Dg Chest Port 1 View  08/13/2012   *RADIOLOGY REPORT*  Clinical Data: Endotracheal tube position, pneumonia.  PORTABLE CHEST - 1 VIEW  Comparison: August 22, 2012.  Findings: Stable cardiomediastinal silhouette.  Endotracheal tube is unchanged in position.  Right internal jugular catheter line is again noted with tip in the expected position of the right atrium. Large rounded mass is seen posteriorly in the right lung base which may represent hiatal hernia; lateral chest radiograph is recommended for further evaluation.  No acute pulmonary disease is noted.  IMPRESSION: Endotracheal tube in grossly good position.  Right internal jugular catheter appears to have been advanced slightly with distal tip in the expected position of the right atrium; withdrawal by 2 cm is recommended. Large rounded densities seen posteriorly in the right lung base most consistent with hiatal hernia; lateral radiograph is recommended for confirmation and to rule out neoplasm.   Original Report Authenticated By: Lupita Raider.,  M.D.   Dg Chest Portable 1 View  08/18/2012   *RADIOLOGY REPORT*  Clinical Data: Evaluate central line placement.  Respiratory distress.  PORTABLE CHEST - 1 VIEW  Comparison: 08/16/2012 at 1752.  Findings: Endotracheal tube unchanged.  There has been interval placement of a right IJ central venous catheter with tip over the origin of the cavoatrial junction.  Lungs are adequately inflated without consolidation or effusion.  No definite pneumothorax. Cardiomediastinal silhouette and remainder of the exam is unchanged.  IMPRESSION: No acute cardiopulmonary disease.  Tubes and lines as described.   Original Report Authenticated By: Elberta Fortis, M.D.   Dg Chest Portable 1 View  08/19/2012    *RADIOLOGY REPORT*  Clinical Data: Shortness of breath, intubated  PORTABLE CHEST - 1 VIEW  Comparison: None.  Findings: Endotracheal tube tip is approximately 2.6 cm above carina.  Lungs are clear.  Heart size upper limits normal for technique.  No effusion.  No pneumothorax.  Regional bones unremarkable.  IMPRESSION:  Endotracheal tube  in expected location.  No acute disease.   Original Report Authenticated By: D. Hassell III, MD     CXR:  ett wnl, rt ij wnl, after rt upper lobe, wide Bronchus intermedius>?  ASSESSMENT / PLAN:  PULMONARY A:Acute resp failure, r/o narcotic OD, no overt aspiration noted P:   Last abg reviewed, repeat now to ensure not alkalotic now and then reduce mv if noted Will discuss with family likely poor prognosis pcxr c/w hiatal hernia per rads, will repeat film in am  High MV, no weaning able  CARDIOVASCULAR A: s/p Arrest, Unknown Un witnessed arrest, r/o pulm suppression from OD ?, Shock r/o cardiogenic post arrest P:  Trop assessment nonspecific post arrest, dc further Levophed to MAp goal 70, support of organs CVP assessment, consider bolus repeat Echo assessment Asa addition  RENAL A:  ARF resolving, hypokalemia P:   Avoid free water - brain edema Allow na to 155 supp k  Dc bicarb as k now low  GASTROINTESTINAL A:  NPO, r/o shock liver P:   LFT repeat in am  ppi Start T F  HEMATOLOGIC A:  CVT prevention P:  Sub q hep scd  INFECTIOUS A:  No evidence infection, leukocytosis P:   BC Pct noted, neg  ENDOCRINE A:  At risk hyperglcyemia P:   Cbg, ssi needed tsh wnl  NEUROLOGIC A:  S/p Likely Prolonged downtime arrest, reported headache, r/o SAH, CVA, ich, myoclonus likely , r/o seziure focus R/o tox OD (pos tox) P:   Allow Na to rise No free water eeg ordered, but now exam c/w brain death, consider dc exam c/w brain death will apnea after further history obtained from family about drug use  TODAY'S SUMMARY: If to brain  death exam = apnea testing, poor prognosis, will update family  I have personally obtained a history, examined the patient, evaluated laboratory and imaging results, formulated the assessment and plan and placed orders. CRITICAL CARE: The patient is critically ill with multiple organ systems failure and requires high complexity decision making for assessment and support, frequent evaluation and titration of therapies, application of advanced monitoring technologies and extensive interpretation of multiple databases. Critical Care Time devoted to patient care services described in this note is 40 minutes.   Mcarthur Rossetti. Tyson Alias, MD, FACP Pgr: [phone] [facility]  Pulmonary and Critical Care Medicine [facility] Pager: [phone]  09/05/2012, 8:01 AM

## 2012-09-10 NOTE — Progress Notes (Signed)
eLink Physician-Brief Progress Note Patient Name: Kathy Pace DOB: February 28, 1960 MRN: [id number]  Date of Service  09/09/2012   HPI/Events of Note   Brain dead, family wishes to dc support  eICU Interventions  Extubation order sent, Donor services seen, not donor   Intervention Category Intermediate Interventions: Communication with other healthcare providers and/or family  Nelda Bucks. 09/06/2012, 7:29 PM

## 2012-09-10 NOTE — Progress Notes (Signed)
  Echocardiogram 2D Echocardiogram has been performed.  Cathie Beams 08/26/2012, 11:58 AM

## 2012-09-10 NOTE — Progress Notes (Signed)
Apnea testing by me , RT, RN  Pre O2 given 7.49/25 Post 7.20/ 61 NO resp noted at 10  Min  Declared brain dead - 11:53 pm Donor on way Will notify family of death  Mcarthur Rossetti. Tyson Alias, MD, FACP Pgr: [phone] [facility]

## 2012-09-10 NOTE — Progress Notes (Signed)
Terminally Extubated pt. To room air as per MD order. No apparent complications. Family at bedside.

## 2012-09-10 DEATH — deceased

## 2013-04-10 DEATH — deceased

## 2013-11-08 IMAGING — CR DG CHEST 1V PORT
1 series · 1 of 1 positions shown · non-contrast
Comparison: None.

CLINICAL DATA: Shortness of breath, intubated

PORTABLE CHEST - 1 VIEW

[AP]
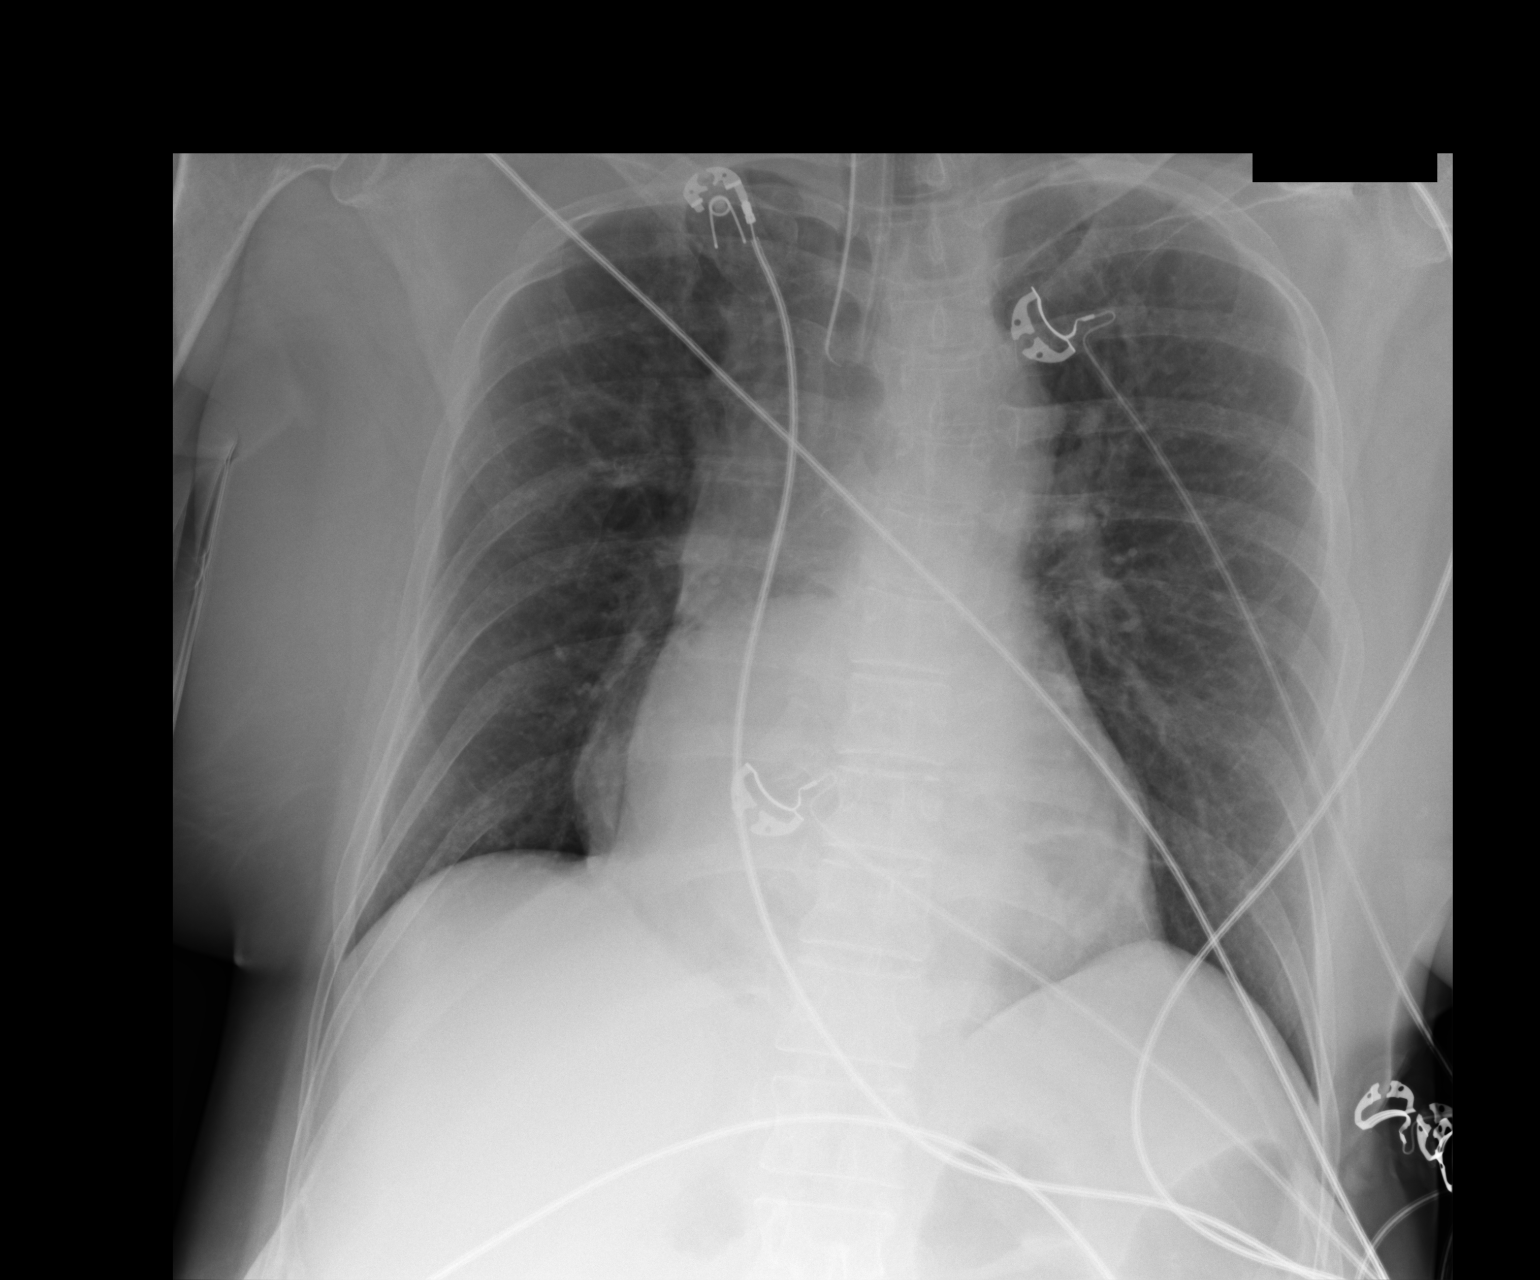

[1 of 1 positions shown; findings below may reference images not displayed]

FINDINGS: Endotracheal tube tip is approximately 2.6 cm above
carina.  Lungs are clear.  Heart size upper limits normal for
technique.  No effusion.  No pneumothorax.  Regional bones
unremarkable.
IMPRESSION: Endotracheal tube in expected location.  No acute disease.
